# Patient Record
Sex: Male | Born: 1975 | Race: White | Hispanic: No | Smoking: Never smoker
Health system: Southern US, Community
[De-identification: ages and names within clinical notes are randomized; demographics above are authoritative.]

## PROBLEM LIST (undated history)

## (undated) DIAGNOSIS — J302 Other seasonal allergic rhinitis: Secondary | ICD-10-CM

## (undated) HISTORY — PX: TONSILLECTOMY: SHX5217

## (undated) HISTORY — PX: WISDOM TOOTH EXTRACTION: SHX21

## (undated) HISTORY — DX: Other seasonal allergic rhinitis: J30.2

---
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
## 1996-12-17 HISTORY — PX: APPENDECTOMY: SHX54

## 2000-08-03 ENCOUNTER — Emergency Department (HOSPITAL_COMMUNITY): Admission: EM | Admit: 2000-08-03 | Discharge: 2000-08-03 | Payer: Self-pay | Admitting: Emergency Medicine

## 2000-08-03 ENCOUNTER — Encounter: Payer: Self-pay | Admitting: Emergency Medicine

## 2000-12-21 ENCOUNTER — Inpatient Hospital Stay (HOSPITAL_COMMUNITY): Admission: EM | Admit: 2000-12-21 | Discharge: 2000-12-23 | Payer: Self-pay | Admitting: *Deleted

## 2003-11-11 ENCOUNTER — Emergency Department (HOSPITAL_COMMUNITY): Admission: EM | Admit: 2003-11-11 | Discharge: 2003-11-11 | Payer: Self-pay | Admitting: Emergency Medicine

## 2006-04-29 ENCOUNTER — Ambulatory Visit: Payer: Self-pay | Admitting: Family Medicine

## 2006-05-15 ENCOUNTER — Ambulatory Visit: Payer: Self-pay | Admitting: Family Medicine

## 2006-07-10 ENCOUNTER — Ambulatory Visit: Payer: Self-pay | Admitting: Family Medicine

## 2007-12-16 ENCOUNTER — Ambulatory Visit: Payer: Self-pay | Admitting: Pulmonary Disease

## 2007-12-16 DIAGNOSIS — R059 Cough, unspecified: Secondary | ICD-10-CM | POA: Insufficient documentation

## 2007-12-16 DIAGNOSIS — R05 Cough: Secondary | ICD-10-CM

## 2008-01-04 DIAGNOSIS — J309 Allergic rhinitis, unspecified: Secondary | ICD-10-CM | POA: Insufficient documentation

## 2011-05-04 NOTE — H&P (Signed)
[facility]  Patient:    Jeffrey Quinn, Jeffrey Quinn                      MRN: [id number] Adm. Date:  [id number] Attending:  Otilio Saber Dictator:   Young Berry Lorin Picket, N.P.                   Psychiatric Admission Assessment  DATE OF ADMISSION:  December 21, 2000  PATIENT IDENTIFICATION:  This is a 35 year old white male, single, voluntary admission to the [facility] on December 21, 2000.  REASON FOR ADMISSION AND SYMPTOMS:  Patients chief complaint is that he has a problem with emotional stress and suicidal thoughts from breakup with his girlfriend.  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  At that time he met a 35 year old girl, a student there.  The patient eventually gave up his job to pursue the relationship and the girlfriend subsequently broke up with him just this past week after dating him for an entire year. Patient began feeling depressed about 3 months ago over the cumulative loss of his job and also had really changed his circle of friends and given up a lot of his previous social supports because of the relationship with this girl, and realizing this had increased his depression and he began having thoughts of not wanting to live.  Over the course of January 4th and 5th he broke up with the girl and had an acute grief reaction where he was sad, he was crying, with thoughts of wanting to die, without a specific suicidal plan.  He called [facility] answering service and was referred for admission.  Now today he denies that he is having any suicidal ideation at this time, that that has pretty much subsided.  He feels calmer, and he denies any auditory or visual hallucinations.  He does feel quite fatigued and he feels extremely sad and despondent over the relationship.  Over the course of the past 2 to 3 weeks, he has felt  increasingly depressed, but states that basically his sleep and appetite were quite good up until the last few days. During the time he was breaking up with her, he had really not been able to eat or sleep for the past 3 to 4 days.  PAST PSYCHIATRIC HISTORY:  Patient saw Dr. Janann August at [facility] one time 5 years ago when he was having adjustment problems in college.  He also took Prozac 20 mg per day through his freshman and sophomore year that was prescribed by his primary care Patina Spanier and eventually weaned himself off that.  He has been seeing Drenda Freeze Dito for the past 3 months as he began to become more depressed.  This Herb Grays is the counselor at [facility], and he saw his primary care Ziyad Dyar for a prescription for Prozac.  He has no previous history of suicidal attempts.  He has no previous hospitalizations.  SUBSTANCE ABUSE HISTORY:  He denies any alcohol or drug use and he is a nonsmoker.  PAST MEDICAL HISTORY:  The patient is cared for by Dr. Tery Sanfilippo at [facility].  He was last seen there December 12 to increase his Prozac dose to 40 mg.  Medical problems:  Only medical problem is seasonal allergies occasionally.  Medications that he takes are none.  Drug allergies are none.  POSITIVE PHYSICAL FINDINGS:  His PE is pending.  He is a well-nourished, well- developed white male in no acute distress, healthy in appearance.  On admission, his temp was 97.1, pulse was 69, respirations 18, blood pressure 140/68 sitting and 130/76 standing.  Today, his blood pressure has come down some.  It was 128/68 sitting and standing 103/61, and it is noticed that he does have postural systolic change for which he is completely asymptomatic. He is 5 feet 11 inches tall and is 205 pounds.  SOCIAL HISTORY:  Patient lives with his family in [place] in 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  He considers himself athletic and considers himself a religious individual. Although he has had suicidal thoughts, he has rejected these out of concern for his family and for religious reasons.  FAMILY HISTORY:  Positive for his mother having anxiety and panic attacks, positive for 1 sister who has been hospitalized for major depressive disorder, and positive for a grandmother with acute depression involving electro- convulsive therapy.  MENTAL STATUS EXAMINATION:  This is a casually dressed, neatly groomed, mildly anxious and cooperative individual with good eye contact.  In general throughout the interview his speech is normal in rate and tone.  His mood is predominantly sad and tearful.  Thought process is logical, coherent and relevant, without evidence of psychotic symptoms.  Cognitively, he is oriented x 3 and is cognitively intact.  He does not have any suicidal ideation at this time, and he is able to be safe on the unit.  ADMISSION DIAGNOSES: Axis I:    1. Major depression with suicidal ideation.            2. 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 Axis II:   None. Axis III:  None. Axis IV:   Moderate problems with his primary support group. Axis V:    Current 36, past year 51.  INITIAL PLAN OF CARE:  Admit him and we will work to stabilize his mood and thinking and allow him to process his acute grief, and he seems to be making progress doing that.  We have already discussed a plan for follow up with Drenda Freeze Dito after discharge,  probably at least on a weekly basis, and we would consider having him followed for ongoing medication checks either with his primary care physician or with [facility].  We will continue his Prozac at this point at 40 mg daily and will monitor his mood and thinking, and we will give him Ambien 5  mg p.r.n. for sleep at night.  ESTIMATED LENGTH OF STAY:  Two to three days. DD:  12/22/00 TD:  12/22/00 Job: [id number]

## 2011-05-04 NOTE — Discharge Summary (Signed)
[facility]  Patient:    Jeffrey Quinn, Jeffrey Quinn                      MRN: [id number] Adm. Date:  [id number] Disc. Date: [id number] Attending:  Otilio Saber                           Discharge Summary  HISTORY OF PRESENT ILLNESS:  Mr. Jeffrey Quinn is a 35 year old single white male admitted with a history of depression and suicidal ideation.  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  He eventually gave up his job to pursue a relationship. His girlfriend subsequently broke up with him the week prior to admission after dating him for one year.  The patient began feeling depressed three months prior to admission over the loss of his job, the change of his life circumstances as a result of the relationship and ongoing conflicts in the relationship.  He became acutely distraught after their breakup and began having suicidal thoughts.  He had reported decreased sleep and appetite over the several days prior to admission.  The patient had seen Dr. Janann August of [facility] one time five years ago when he was having adjustment problems in college.  He took Prozac through his freshman and sophomore years prescribed by his primary care Evelynne Spiers.  He has also been seeing Lawson Radar for three months for therapy and was on Prozac from his primary care physician.  He had no previous psychiatric hospitalizations.  He is followed medically by Dr. Tery Sanfilippo with [facility].  ADMISSION MEDICATIONS:  At the time of admission, he was on Prozac 40 mg q.d.  ALLERGIES:  He had no known drug allergies.  PHYSICAL EXAMINATION:  The physical exam on admission showed no significant findings.  MENTAL STATUS EXAMINATION:  The examination revealed a casually dressed, neatly groomed young white male.  Speech was normal.  Thought processes were logical and coherent without evidence of psychosis.  He  was denying further suicide ideation.  Mood was _____.  Affect was sad and tearful.  Oriented x 3.  Cognitive function was intact.  ADMITTING DIAGNOSES: Axis I:    1. Major depression, severe.            2. Adjustment disorder, not otherwise specified. Axis II:   No diagnosis. Axis III:  No diagnosis. Axis IV:   Psychosocial stressors moderate. Axis V:    Global assessment of functioning currently 35, highest in            the past year is 75.  HOSPITAL COURSE:  The patient was admitted to the [facility] for treatment of depression to prevent any suicidal acting out.  The patient was continued on Prozac and was seen in individual therapy.  He was able to talk through some of his feelings about loss of his relationship and previous job. He was denying any further suicidal thoughts and thought that he was able to work with getting on with his life.  He agreed to leave the relationship alone until he had worked through these issues.   It was felt that he could be managed again on an outpatient basis.  DISCHARGE CONDITION:  The patient is discharged in improved condition with improvement in his mood, sleep and appetite and alleviation of his suicidal ideation.  DISPOSITION:  The  patient is discharged home.  He was to follow up in my office on December 26, 2000, and was to meet with Lawson Radar through my office on December 25, 2000.  DISCHARGE MEDICATIONS:  Prozac 40 mg q.d.  FINAL DIAGNOSES: Axis I:    1. Depression, not otherwise specified. Axis II:   No diagnosis. Axis III:  No diagnosis. Axis IV:   Psychosocial stressors moderate. Axis V:    Global assessment of functioning current 52, highest            in the past year is 75. DD:  01/24/01 TD:  01/25/01 Job: [id number]

## 2014-12-10 ENCOUNTER — Encounter (HOSPITAL_COMMUNITY): Payer: Self-pay | Admitting: Emergency Medicine

## 2014-12-10 ENCOUNTER — Emergency Department (HOSPITAL_COMMUNITY)
Admission: EM | Admit: 2014-12-10 | Discharge: 2014-12-10 | Disposition: A | Discharge status: Home / Self Care | Payer: BC Managed Care – PPO | Attending: Emergency Medicine | Admitting: Emergency Medicine

## 2014-12-10 ENCOUNTER — Emergency Department (HOSPITAL_COMMUNITY): Payer: BC Managed Care – PPO

## 2014-12-10 ENCOUNTER — Emergency Department (HOSPITAL_COMMUNITY)
Admission: EM | Admit: 2014-12-10 | Discharge: 2014-12-11 | Disposition: A | Discharge status: Home / Self Care | Payer: BC Managed Care – PPO | Source: Home / Self Care | Attending: Emergency Medicine | Admitting: Emergency Medicine

## 2014-12-10 DIAGNOSIS — M549 Dorsalgia, unspecified: Secondary | ICD-10-CM

## 2014-12-10 DIAGNOSIS — Z7951 Long term (current) use of inhaled steroids: Secondary | ICD-10-CM | POA: Insufficient documentation

## 2014-12-10 DIAGNOSIS — M545 Low back pain: Secondary | ICD-10-CM | POA: Insufficient documentation

## 2014-12-10 DIAGNOSIS — Z87442 Personal history of urinary calculi: Secondary | ICD-10-CM | POA: Diagnosis not present

## 2014-12-10 DIAGNOSIS — Z79899 Other long term (current) drug therapy: Secondary | ICD-10-CM | POA: Insufficient documentation

## 2014-12-10 DIAGNOSIS — M62830 Muscle spasm of back: Secondary | ICD-10-CM | POA: Insufficient documentation

## 2014-12-10 DIAGNOSIS — Z791 Long term (current) use of non-steroidal anti-inflammatories (NSAID): Secondary | ICD-10-CM | POA: Insufficient documentation

## 2014-12-10 LAB — I-STAT CHEM 8, ED
BUN: 32 mg/dL — ABNORMAL HIGH (ref 6–23)
Calcium, Ion: 1.17 mmol/L (ref 1.12–1.23)
Chloride: 99 mEq/L (ref 96–112)
Creatinine, Ser: 1.1 mg/dL (ref 0.50–1.35)
GLUCOSE: 106 mg/dL — AB (ref 70–99)
HEMATOCRIT: 48 % (ref 39.0–52.0)
Hemoglobin: 16.3 g/dL (ref 13.0–17.0)
Potassium: 4.5 mmol/L (ref 3.5–5.1)
SODIUM: 138 mmol/L (ref 135–145)
TCO2: 28 mmol/L (ref 0–100)

## 2014-12-10 LAB — URINALYSIS, ROUTINE W REFLEX MICROSCOPIC
Bilirubin Urine: NEGATIVE
Glucose, UA: NEGATIVE mg/dL
Hgb urine dipstick: NEGATIVE
Ketones, ur: NEGATIVE mg/dL
Leukocytes, UA: NEGATIVE
NITRITE: NEGATIVE
Protein, ur: NEGATIVE mg/dL
SPECIFIC GRAVITY, URINE: 1.017 (ref 1.005–1.030)
Urobilinogen, UA: 0.2 mg/dL (ref 0.0–1.0)
pH: 7 (ref 5.0–8.0)

## 2014-12-10 LAB — CBG MONITORING, ED: GLUCOSE-CAPILLARY: 82 mg/dL (ref 70–99)

## 2014-12-10 MED ORDER — NAPROXEN 500 MG PO TABS: 500.0000 mg | ORAL_TABLET | Freq: Two times a day (BID) | ORAL | Status: DC

## 2014-12-10 MED ORDER — DIAZEPAM 5 MG PO TABS: 5.0000 mg | ORAL_TABLET | Freq: Four times a day (QID) | ORAL | Status: DC | PRN

## 2014-12-10 MED ORDER — DIAZEPAM 5 MG PO TABS
5.0000 mg | ORAL_TABLET | Freq: Once | ORAL | Status: AC
  Administered 2014-12-10: 5 mg via ORAL
  Filled 2014-12-10: qty 1

## 2014-12-10 MED ORDER — DIAZEPAM 5 MG/ML IJ SOLN
5.0000 mg | Freq: Once | INTRAMUSCULAR | Status: AC
  Administered 2014-12-11: 5 mg via INTRAVENOUS
  Filled 2014-12-10: qty 2

## 2014-12-10 MED ORDER — HYDROMORPHONE HCL 1 MG/ML IJ SOLN
1.0000 mg | INTRAMUSCULAR | Status: DC | PRN
  Administered 2014-12-10 (×2): 1 mg via INTRAVENOUS
  Filled 2014-12-10 (×2): qty 1

## 2014-12-10 MED ORDER — KETOROLAC TROMETHAMINE 60 MG/2ML IM SOLN
60.0000 mg | Freq: Once | INTRAMUSCULAR | Status: AC
  Administered 2014-12-10: 60 mg via INTRAMUSCULAR
  Filled 2014-12-10: qty 2

## 2014-12-10 MED ORDER — OXYCODONE-ACETAMINOPHEN 5-325 MG PO TABS: 2.0000 | ORAL_TABLET | ORAL | Status: DC | PRN

## 2014-12-10 NOTE — ED Provider Notes (Signed)
CSN: [id number]     Arrival date & time 12/10/14  2155 History   First MD Initiated Contact with Patient 12/10/14 2200     Chief Complaint  Patient presents with  . Back Pain    HPI Patient presents to the emergency room with complaints of persistent low back pain. Patient initially injured himself while lifting weights a few weeks ago. He was getting treatment at a chiropractor with intermittent relief. Patient is here visiting family. He started having pain again a few days ago. He went to a chiropractor and had a treatment that offered some temporary relief again. This morning the pain was more severe. The patient felt like his back was locking up.  Pain is in his lower back and then radiates throughout the spine. He denies any trouble with nausea or vomiting. He has no trouble with numbness or weakness. He is not having any issues with urination. The pain goes down towards his hips but does not radiate to his legs or feet. Movement increases the pain. He was seen in the emergency department earlier today and given pain medications. He tried taking them at home but they were ineffective so he came back into the ED this evening. History reviewed. No pertinent past medical history. History reviewed. No pertinent past surgical history. No family history on file. History  Substance Use Topics  . Smoking status: Never Smoker   . Smokeless tobacco: Not on file  . Alcohol Use: No    Review of Systems  All other systems reviewed and are negative.     Allergies  Review of patient's allergies indicates no known allergies.  Home Medications   Prior to Admission medications   Medication Sig Start Date End Date Taking? Authorizing Provider  diazepam (VALIUM) 5 MG tablet Take 1 tablet (5 mg total) by mouth every 6 (six) hours as needed for anxiety (spasms). 12/10/14   Earle GellBenjamin W Cartner, PA-C  Docusate Calcium (STOOL SOFTENER PO) Take 2 capsules by mouth daily.    Historical Provider, MD   fluticasone (FLONASE) 50 MCG/ACT nasal spray Place 2 sprays into both nostrils daily.    Historical Provider, MD  KRILL OIL PO Take 1 capsule by mouth daily.    Historical Provider, MD  loratadine (CLARITIN) 10 MG tablet Take 10 mg by mouth daily.    Historical Provider, MD  methocarbamol (ROBAXIN) 500 MG tablet Take 500 mg by mouth 2 (two) times daily.    Historical Provider, MD  naproxen (NAPROSYN) 500 MG tablet Take 1 tablet (500 mg total) by mouth 2 (two) times daily. 12/10/14   Earle GellBenjamin W Cartner, PA-C  naproxen sodium (ANAPROX) 220 MG tablet Take 660 mg by mouth 2 (two) times daily with a meal.    Historical Provider, MD  niacin 100 MG tablet Take 100 mg by mouth daily.    Historical Provider, MD  Omega-3 Fatty Acids (FISH OIL) 1000 MG CAPS Take 2 capsules by mouth daily.    Historical Provider, MD  oxyCODONE-acetaminophen (PERCOCET/ROXICET) 5-325 MG per tablet Take 2 tablets by mouth every 4 (four) hours as needed for moderate pain or severe pain. 12/10/14   Earle GellBenjamin W Cartner, PA-C  ranitidine (ZANTAC) 150 MG tablet Take 150 mg by mouth daily.    Historical Provider, MD   BP 117/68 mmHg  Pulse 62  Temp(Src) 98.2 F (36.8 C) (Oral)  Resp 16  SpO2 95% Physical Exam  Constitutional: He appears well-developed and well-nourished.  HENT:  Head: Normocephalic and atraumatic.  Right Ear: External ear normal.  Left Ear: External ear normal.  Nose: Nose normal.  Eyes: Conjunctivae and EOM are normal.  Neck: Neck supple. No tracheal deviation present.  Pulmonary/Chest: Effort normal. No stridor. No respiratory distress.  Musculoskeletal: He exhibits no edema or tenderness.       Lumbar back: He exhibits decreased range of motion, pain and spasm. He exhibits no swelling and no edema.  Neurological: He is alert. He is not disoriented. No cranial nerve deficit or sensory deficit. He exhibits normal muscle tone. Coordination normal.  Reflex Scores:      Patellar reflexes are 2+ on the right  side and 2+ on the left side.      Achilles reflexes are 2+ on the right side and 2+ on the left side. Skin: Skin is warm and dry. No rash noted. He is not diaphoretic. No erythema.  Psychiatric: He has a normal mood and affect. His behavior is normal. Thought content normal.  Nursing note and vitals reviewed.   ED Course  Procedures (including critical care time) Labs Review Labs Reviewed  I-STAT CHEM 8, ED - Abnormal; Notable for the following:    BUN 32 (*)    Glucose, Bld 106 (*)    All other components within normal limits  URINALYSIS, ROUTINE W REFLEX MICROSCOPIC    Imaging Review Dg Lumbar Spine Complete  12/10/2014   CLINICAL DATA:  Low back pain. Weight lifting injury 1 month ago, originally getting better but recurrent today.  EXAM: LUMBAR SPINE - COMPLETE 4+ VIEW  COMPARISON:  11/26/2005  FINDINGS: A transitional lumbosacral vertebra is assumed to represent the S1 level. Careful correlation with this numbering strategy prior to any procedural intervention would be recommended. Mild degenerative facet arthropathy on the right at L5-S1. No malalignment or significant loss of intervertebral disc height. No acute bony findings.  IMPRESSION: 1. No fracture, subluxation, or acute findings. 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   Electronically Signed   By: Herbie BaltimoreWalt  Liebkemann M.D.   On: 12/10/2014 13:49     EKG Interpretation None      MDM   Final diagnoses:  Low back pain without sciatica, unspecified back pain laterality    Pt's symptoms do not suggest a radicular etiology.   Family is concerned about his electrolytes.  He has been drinking a lot of water.  Will check istat and ua.  Plan on treating with pain medications.  No signs of acute radiculopathy or neuro dysfucntion.   Jeffrey DibblesJon Daana Petrasek, MD 12/10/14 [id number]

## 2014-12-10 NOTE — ED Notes (Signed)
Patient reports taking Valium, Naproxsyn and Percocet at approximately 1830 without relief.

## 2014-12-10 NOTE — ED Notes (Signed)
Pt reports pain better. Feels a little more relaxed.  States pain was 9-10/10 on arrival now its 7-8/10.

## 2014-12-10 NOTE — ED Notes (Signed)
Pt lightheaded when standing up.  States he feels like he's going to pass out.

## 2014-12-10 NOTE — ED Notes (Signed)
Pt walked out.  He looked pale but states he is fine and left.

## 2014-12-10 NOTE — ED Notes (Signed)
Blood sugar 82.

## 2014-12-10 NOTE — Discharge Instructions (Signed)
Back Pain, Adult °Low back pain is very common. About 1 in 5 people have back pain. The cause of low back pain is rarely dangerous. The pain often gets better over time. About half of people with a sudden onset of back pain feel better in just 2 weeks. About 8 in 10 people feel better by 6 weeks.  °CAUSES °Some common causes of back pain include: °· Strain of the muscles or ligaments supporting the spine. °· Wear and tear (degeneration) of the spinal discs. °· Arthritis. °· Direct injury to the back. °DIAGNOSIS °Most of the time, the direct cause of low back pain is not known. However, back pain can be treated effectively even when the exact cause of the pain is unknown. Answering your caregiver's questions about your overall health and symptoms is one of the most accurate ways to make sure the cause of your pain is not dangerous. If your caregiver needs more information, he or she may order lab work or imaging tests (X-rays or MRIs). However, even if imaging tests show changes in your back, this usually does not require surgery. °HOME CARE INSTRUCTIONS °For many people, back pain returns. Since low back pain is rarely dangerous, it is often a condition that people can learn to manage on their own.  °· Remain active. It is stressful on the back to sit or stand in one place. Do not sit, drive, or stand in one place for more than 30 minutes at a time. Take short walks on level surfaces as soon as pain allows. Try to increase the length of time you walk each day. °· Do not stay in bed. Resting more than 1 or 2 days can delay your recovery. °· Do not avoid exercise or work. Your body is made to move. It is not dangerous to be active, even though your back may hurt. Your back will likely heal faster if you return to being active before your pain is gone. °· Pay attention to your body when you  bend and lift. Many people have less discomfort when lifting if they bend their knees, keep the load close to their bodies, and  avoid twisting. Often, the most comfortable positions are those that put less stress on your recovering back. °· Find a comfortable position to sleep. Use a firm mattress and lie on your side with your knees slightly bent. If you lie on your back, put a pillow under your knees. °· Only take over-the-counter or prescription medicines as directed by your caregiver. Over-the-counter medicines to reduce pain and inflammation are often the most helpful. Your caregiver may prescribe muscle relaxant drugs. These medicines help dull your pain so you can more quickly return to your normal activities and healthy exercise. °· Put ice on the injured area. °¨ Put ice in a plastic bag. °¨ Place a towel between your skin and the bag. °¨ Leave the ice on for 15-20 minutes, 03-04 times a day for the first 2 to 3 days. After that, ice and heat may be alternated to reduce pain and spasms. °· Ask your caregiver about trying back exercises and gentle massage. This may be of some benefit. °· Avoid feeling anxious or stressed. Stress increases muscle tension and can worsen back pain. It is important to recognize when you are anxious or stressed and learn ways to manage it. Exercise is a great option. °SEEK MEDICAL CARE IF: °· You have pain that is not relieved with rest or medicine. °· You have pain that does not improve in 1 week. °· You have new symptoms. °· You are generally not feeling well. °SEEK   IMMEDIATE MEDICAL CARE IF:   You have pain that radiates from your back into your legs.  You develop new bowel or bladder control problems.  You have unusual weakness or numbness in your arms or legs.  You develop nausea or vomiting.  You develop abdominal pain.  You feel faint. Document Released: 12/03/2005 Document Revised: 06/03/2012 Document Reviewed: 04/06/2014 [facility] Patient Information 2015 [employer], [place]. This information is not intended to replace advice given to you by your health care provider. Make sure you  discuss any questions you have with your health care provider.   You were evaluated in the ED today for your back pain. There does not appear to be an emergent cause for her symptoms at this time. Please take your pain medicine as directed for severe pain. Take your naproxen as directed for the next 2 weeks. Please follow-up with primary care for further evaluation and management of your symptoms. Return to ED for worsening symptoms, worsening pain, fevers, numbness or weakness, difficulties urinating or going to the bathroom.

## 2014-12-10 NOTE — ED Notes (Signed)
Was lifting something heavy 3 weeks ago and "tweeked" his back and then dropped the weight.  Pt has had pain in LS spine since then.  Family at bedside.  Has been stretching and taking ice baths since then and it was getting better.  State he got out of bed today with extreem pain today.  States weakness below nipple line but denies any type of incontinence. Mother at bedside.  IV placed. And pain meds given.  Pt gone to xray

## 2014-12-10 NOTE — ED Notes (Signed)
Bed: [id number] Expected date:  Expected time:  Means of arrival:  Comments: Back pain - EMS

## 2014-12-10 NOTE — ED Notes (Addendum)
Patient rates pain 6/10 since EMS gave 150mcg Fentanyl. C/o spasms and cramping. Denies any other symptoms at this time.

## 2014-12-10 NOTE — ED Notes (Signed)
Patient presents from home via EMS for back pain. Patient reports injury to back a few week prior while working out. Patient seen earlier today for same. Patient prescribed medications without relief.   BP110/66, 60HR,   150mcg Fentanyl in route (last does 2144), pain 5/10 upon arrival.

## 2014-12-10 NOTE — ED Provider Notes (Signed)
CSN: [id number]     Arrival date & time 12/10/14  1243 History   First MD Initiated Contact with Patient 12/10/14 1258     Chief Complaint  Patient presents with  . Back Pain     (Consider location/radiation/quality/duration/timing/severity/associated sxs/prior Treatment) HPI Jeffrey Quinn is a 38 y.o. male with a history of nephrolithiasis, appendectomy comes in for evaluation of back pain. Patient states approximately 3-1/2 weeks ago he was power lifting, performed a power clean and "tweaked his back". He is since visited a chiropractor who "popped his back" which improved his pain for approximately 4 hours. He has tried Percocet at home with minimal relief. He is concerned because when he woke up this morning he felt like his hips were too far from his spine "in an S shape". He denies this feeling now. He reports the pain is an achiness and is localized to his right lower lumbar spine. He also reports intermittent shooting pains in both of his legs. He denies fevers, night sweats, personal history of cancer, IV drug use, numbness or weakness, loss of bowel or bladder, chronic steroid use.  History reviewed. No pertinent past medical history. History reviewed. No pertinent past surgical history. History reviewed. No pertinent family history. History  Substance Use Topics  . Smoking status: Never Smoker   . Smokeless tobacco: Not on file  . Alcohol Use: No    Review of Systems A complete 10 point review of systems was completed and was negative except for pertinent positives and negatives as mentioned in the history of present illness.   Allergies  Review of patient's allergies indicates no known allergies.  Home Medications   Prior to Admission medications   Medication Sig Start Date End Date Taking? Authorizing Provider  Docusate Calcium (STOOL SOFTENER PO) Take 2 capsules by mouth daily.   Yes Historical Provider, MD  fluticasone (FLONASE) 50 MCG/ACT nasal spray Place 2  sprays into both nostrils daily.   Yes Historical Provider, MD  KRILL OIL PO Take 1 capsule by mouth daily.   Yes Historical Provider, MD  loratadine (CLARITIN) 10 MG tablet Take 10 mg by mouth daily.   Yes Historical Provider, MD  methocarbamol (ROBAXIN) 500 MG tablet Take 500 mg by mouth 2 (two) times daily.   Yes Historical Provider, MD  naproxen sodium (ANAPROX) 220 MG tablet Take 660 mg by mouth 2 (two) times daily with a meal.   Yes Historical Provider, MD  niacin 100 MG tablet Take 100 mg by mouth daily.   Yes Historical Provider, MD  Omega-3 Fatty Acids (FISH OIL) 1000 MG CAPS Take 2 capsules by mouth daily.   Yes Historical Provider, MD  ranitidine (ZANTAC) 150 MG tablet Take 150 mg by mouth daily.   Yes Historical Provider, MD  diazepam (VALIUM) 5 MG tablet Take 1 tablet (5 mg total) by mouth every 6 (six) hours as needed for anxiety (spasms). 12/10/14   Earle GellBenjamin W Tarnisha Kachmar, PA-C  naproxen (NAPROSYN) 500 MG tablet Take 1 tablet (500 mg total) by mouth 2 (two) times daily. 12/10/14   Sharlene MottsBenjamin W Damarie Schoolfield, PA-C  oxyCODONE-acetaminophen (PERCOCET/ROXICET) 5-325 MG per tablet Take 2 tablets by mouth every 4 (four) hours as needed for moderate pain or severe pain. 12/10/14   Earle GellBenjamin W Hines Kloss, PA-C   BP 138/76 mmHg  Pulse 84  Temp(Src) 97.8 F (36.6 C) (Oral)  Resp 20  SpO2 99% Physical Exam  Constitutional:  Awake, alert, nontoxic appearance with baseline speech.  HENT:  Head:  Atraumatic.  Eyes: Pupils are equal, round, and reactive to light. Right eye exhibits no discharge. Left eye exhibits no discharge.  Neck: Neck supple.  Cardiovascular: Normal rate and regular rhythm.   No murmur heard. Pulmonary/Chest: Effort normal and breath sounds normal. No respiratory distress. He has no wheezes. He has no rales. He exhibits no tenderness.  Abdominal: Soft. Bowel sounds are normal. He exhibits no mass. There is no tenderness. There is no rebound.  Musculoskeletal:  Tenderness diffusely  through back, specifically through paraspinal lumbar muscles. Mild tenderness over lumbar spine with no specific focal point of tenderness. Bilateral lower extremities non tender without new rashes or color change, baseline ROM with intact DP / PT pulses, CR<2 secs all digits bilaterally, sensation baseline light touch bilaterally for pt, DTR's symmetric and intact bilaterally KJ / AJ, motor symmetric bilateral 5 / 5 hip flexion, quadriceps, hamstrings, EHL, foot dorsiflexion, foot plantarflexion, gait somewhat antalgic but without apparent new ataxia.  Neurological:  Mental status baseline for patient.  Upper extremity motor strength and sensation intact and symmetric bilaterally.  Skin: No rash noted.  Psychiatric: He has a normal mood and affect.  Nursing note and vitals reviewed.   ED Course  Procedures (including critical care time) Labs Review Labs Reviewed  CBG MONITORING, ED    Imaging Review Dg Lumbar Spine Complete  12/10/2014   CLINICAL DATA:  Low back pain. Weight lifting injury 1 month ago, originally getting better but recurrent today.  EXAM: LUMBAR SPINE - COMPLETE 4+ VIEW  COMPARISON:  11/26/2005  FINDINGS: A transitional lumbosacral vertebra is assumed to represent the S1 level. Careful correlation with this numbering strategy prior to any procedural intervention would be recommended. Mild degenerative facet arthropathy on the right at L5-S1. No malalignment or significant loss of intervertebral disc height. No acute bony findings.  IMPRESSION: 1. No fracture, subluxation, or acute findings. 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   Electronically Signed   By: [name] M.D.   On: 12/10/2014 13:49     EKG Interpretation None     Meds given in ED:  Medications  diazepam (VALIUM) tablet 5 mg (5 mg Oral Given 12/10/14 1325)  ketorolac (TORADOL) injection 60 mg (60 mg Intramuscular Given 12/10/14 1325)    New Prescriptions    DIAZEPAM (VALIUM) 5 MG TABLET    Take 1 tablet (5 mg total) by mouth every 6 (six) hours as needed for anxiety (spasms).   NAPROXEN (NAPROSYN) 500 MG TABLET    Take 1 tablet (500 mg total) by mouth 2 (two) times daily.   OXYCODONE-ACETAMINOPHEN (PERCOCET/ROXICET) 5-325 MG PER TABLET    Take 2 tablets by mouth every 4 (four) hours as needed for moderate pain or severe pain.   Filed Vitals:   12/10/14 1253  BP: 138/76  Pulse: 84  Temp: 97.8 F (36.6 C)  TempSrc: Oral  Resp: 20  SpO2: 99%    MDM  OTIS PORTAL is a 38 y.o. male who presents for evaluation of back pain. Patient originally "tweaked his back" 3-1/2 weeks ago while power lifting. No pathologic back pain red flags  Vitals stable - WNL -afebrile Pt resting comfortably in ED. pain improved with above medications given in ED. PE--not concerning further acute or emergent pathology. Normal neuro exam. Patient's gait is somewhat antalgic but without any apparent ataxia.  Imaging--x-rays of lumbar spine show no acute osseous abnormalities.  DDX--no evidence of cauda equina, conus medullaris syndrome, epidural abscess  or other cord compression pathology. Likely musculoskeletal strain with potential disc herniation. Will treat conservatively.  Will DC with pain medicine, muscle relaxers and anti-inflammatories. Discussed f/u with PCP and return precautions, pt very amenable to plan.  Prior to patient discharge, I discussed and reviewed this case with Dr. Freida BusmanAllen   Final diagnoses:  Back pain        Earle GellBenjamin W Lohrvilleartner, PA-C 12/10/14 1609  Toy BakerAnthony T Allen, MD 12/12/14 [phone]

## 2014-12-10 NOTE — ED Notes (Signed)
PA Ben aware.  Pt given PO fluids

## 2014-12-10 NOTE — ED Notes (Signed)
Pt reports he had back worked on by chiropractor 3 weeks ago. Has gradually begun to have lower back pain through hips worse over past tow days. Pt is able to slowly ambulate. Does heavy lifting. Feels that back is in an "s" shape. No hx of back problems.

## 2014-12-11 MED ORDER — KETOROLAC TROMETHAMINE 60 MG/2ML IM SOLN
60.0000 mg | Freq: Once | INTRAMUSCULAR | Status: AC
  Administered 2014-12-11: 60 mg via INTRAMUSCULAR
  Filled 2014-12-11: qty 2

## 2014-12-11 MED ORDER — PREDNISONE 20 MG PO TABS
60.0000 mg | ORAL_TABLET | Freq: Once | ORAL | Status: AC
  Administered 2014-12-11: 60 mg via ORAL
  Filled 2014-12-11: qty 3

## 2014-12-11 NOTE — Discharge Instructions (Signed)

## 2014-12-11 NOTE — ED Notes (Signed)
Upon d/c patient unable to stand and sit in wheelchair. Patient reports pain increased to 8/10 when attempting to sit up in bed. Mother expresses concern about patient's status. Dr. Mora Bellmanni made aware of same. Will be in to reassess patient. Patient and mother updated on POC.

## 2014-12-11 NOTE — ED Provider Notes (Signed)
Patient signed out as pending Dilaudid treatments for back pain. Patient was attempted to be discharged but could not ambulate. Upon my assessment the patient has no red flags for back pain. He denies fever, IV drug use, malignancy. Patient states he lifted heavy weights 3 weeks ago, it improved over 2 weeks, then when he went running a got worse again over the last 2 days. Full dose of Dilaudid, I added Toradol and prednisone for treatment. He was advised to follow-up with her primary care physician when he gets back to [place] for an MRI and possible surgical intervention. It is my suspicion that this is low back pain that will resolve in 2-3 months of rest and NSAID use. His vital signs remain within his normal limits and the patient is safe for discharge.  Tomasita CrumbleAdeleke Devereaux Grayson, MD 12/11/14 [phone]

## 2015-07-15 IMAGING — CR DG LUMBAR SPINE COMPLETE 4+V
5 series · 5 of 5 positions shown · non-contrast
Comparison: 11/26/2005

CLINICAL DATA: Low back pain. Weight lifting injury 1 month ago,
originally getting better but recurrent today.

EXAM:
LUMBAR SPINE - COMPLETE 4+ VIEW

[t lumbar spine ap]
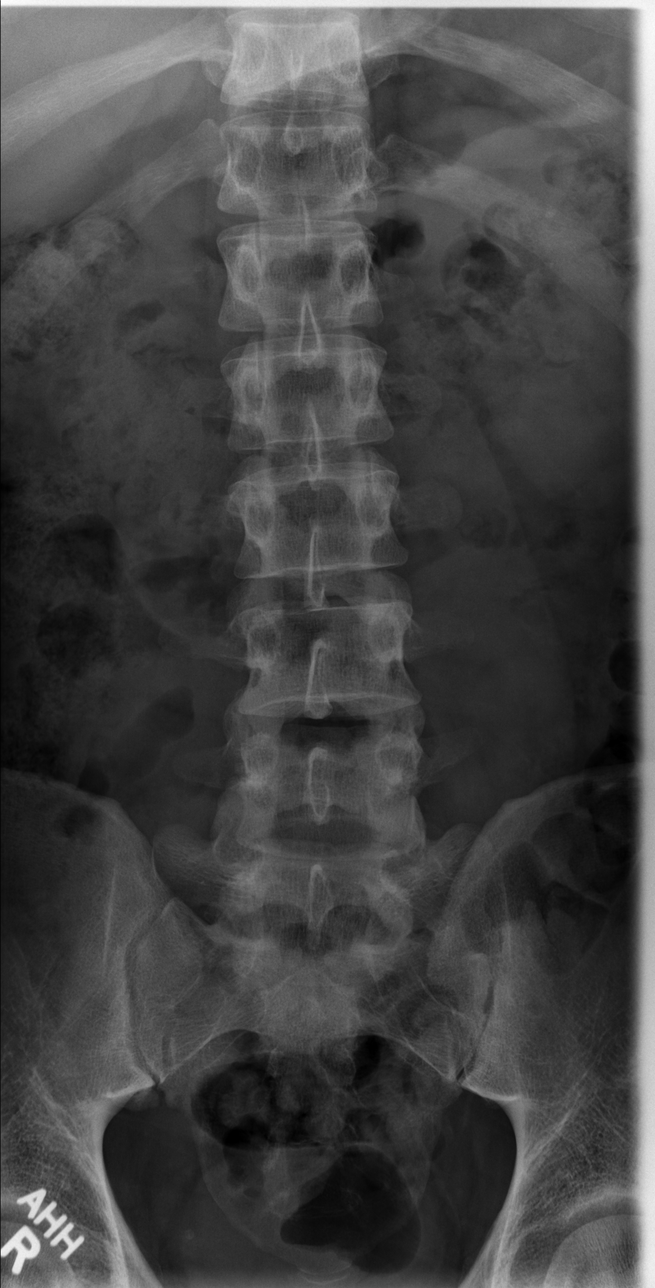

[t lumbar spine obl (1 of 2)]
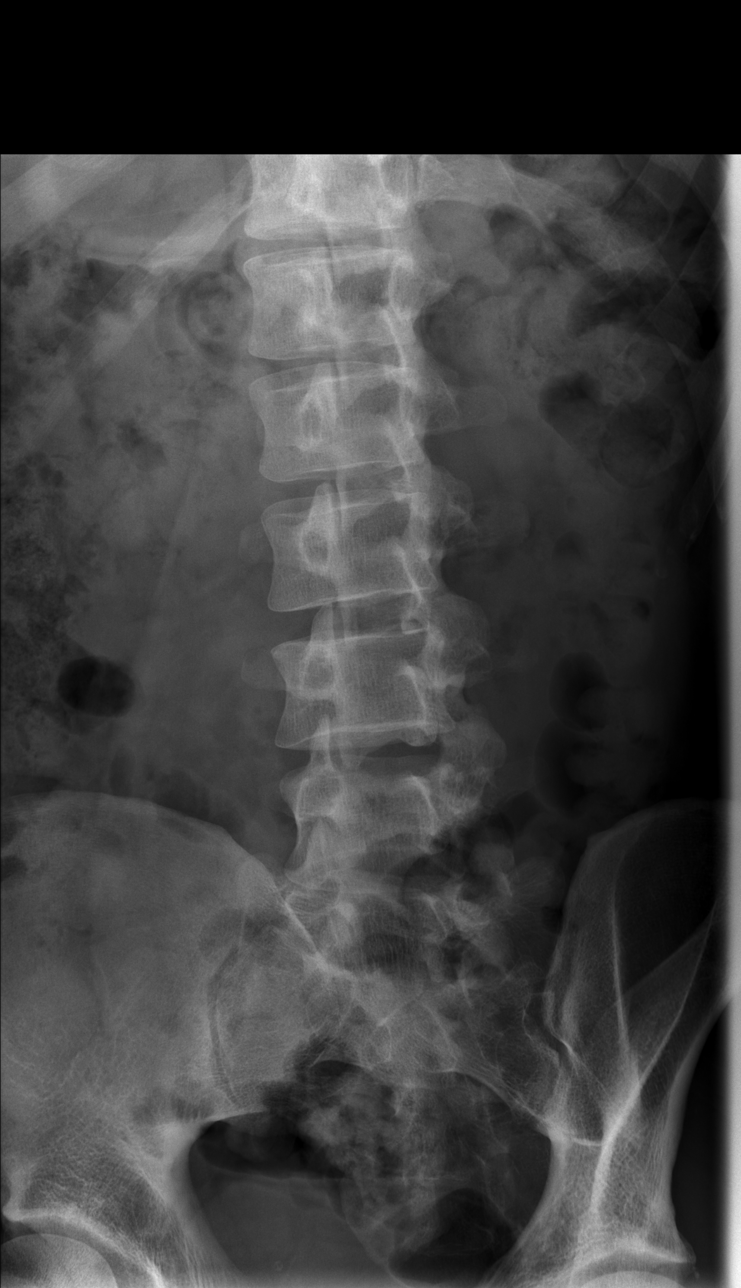

[t lumbar spine obl (2 of 2)]
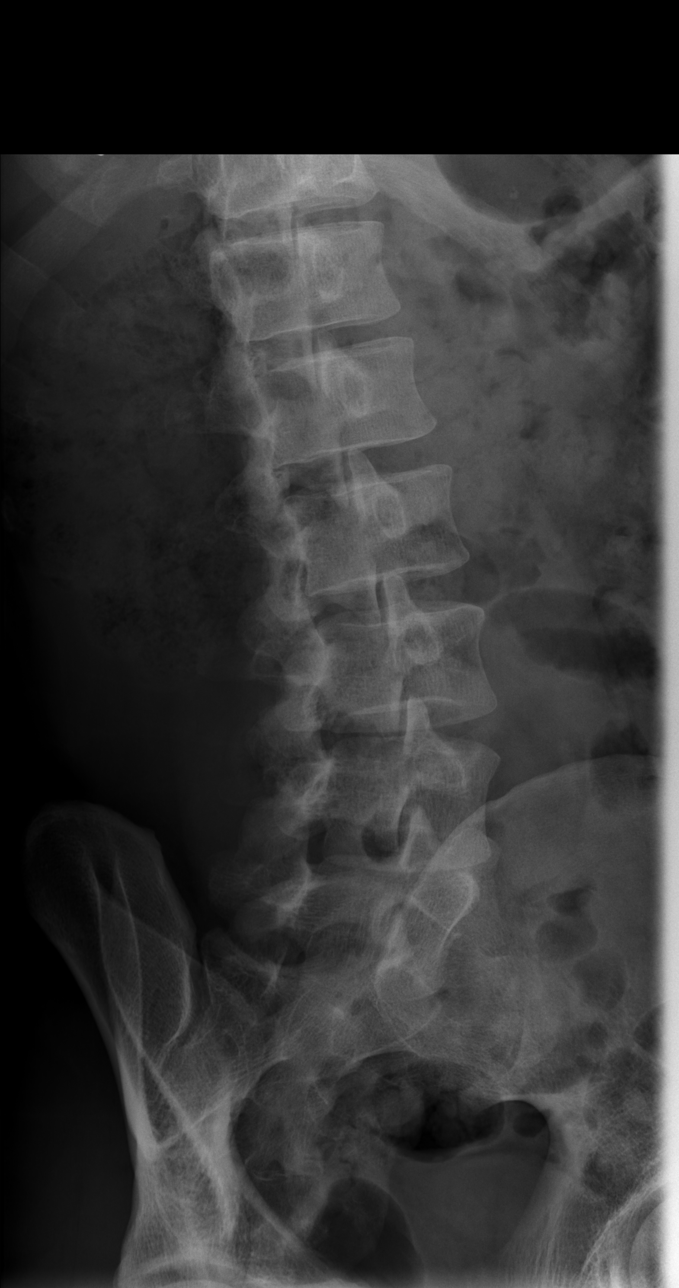

[t lumbar spine lat]
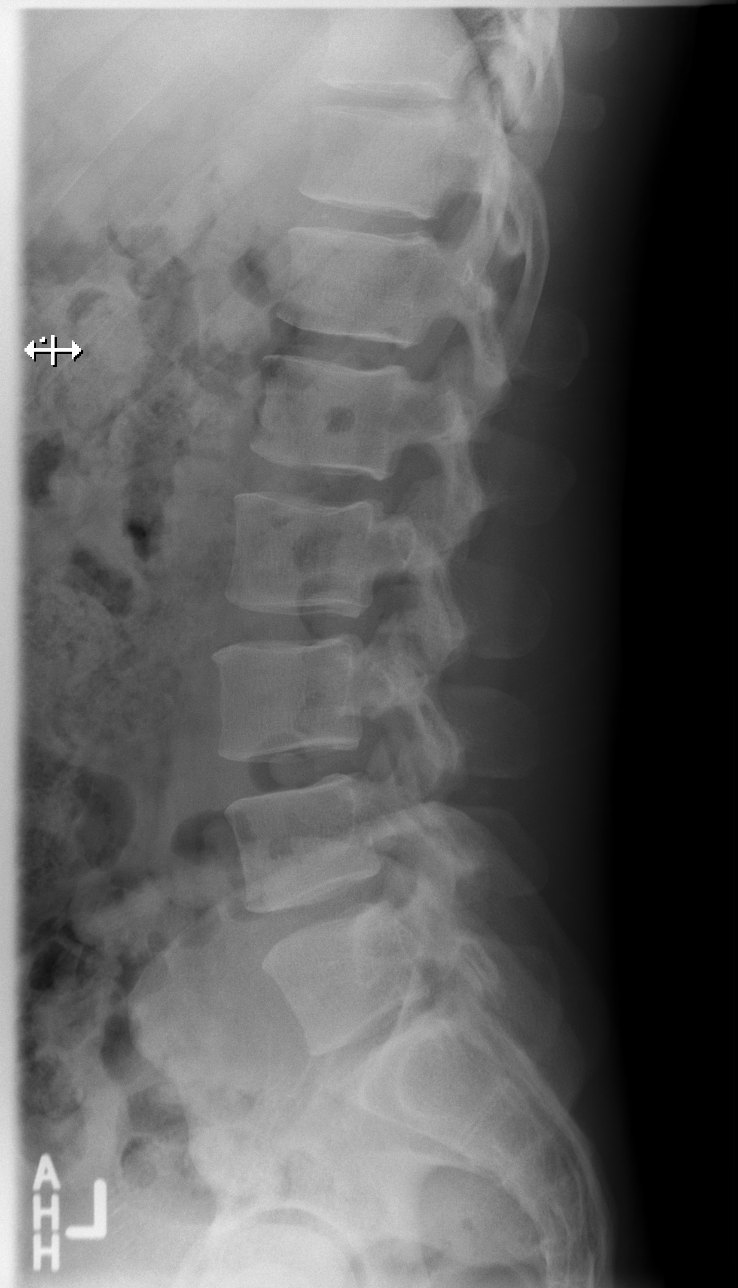

[t lumbar l-5 s-1 spot]
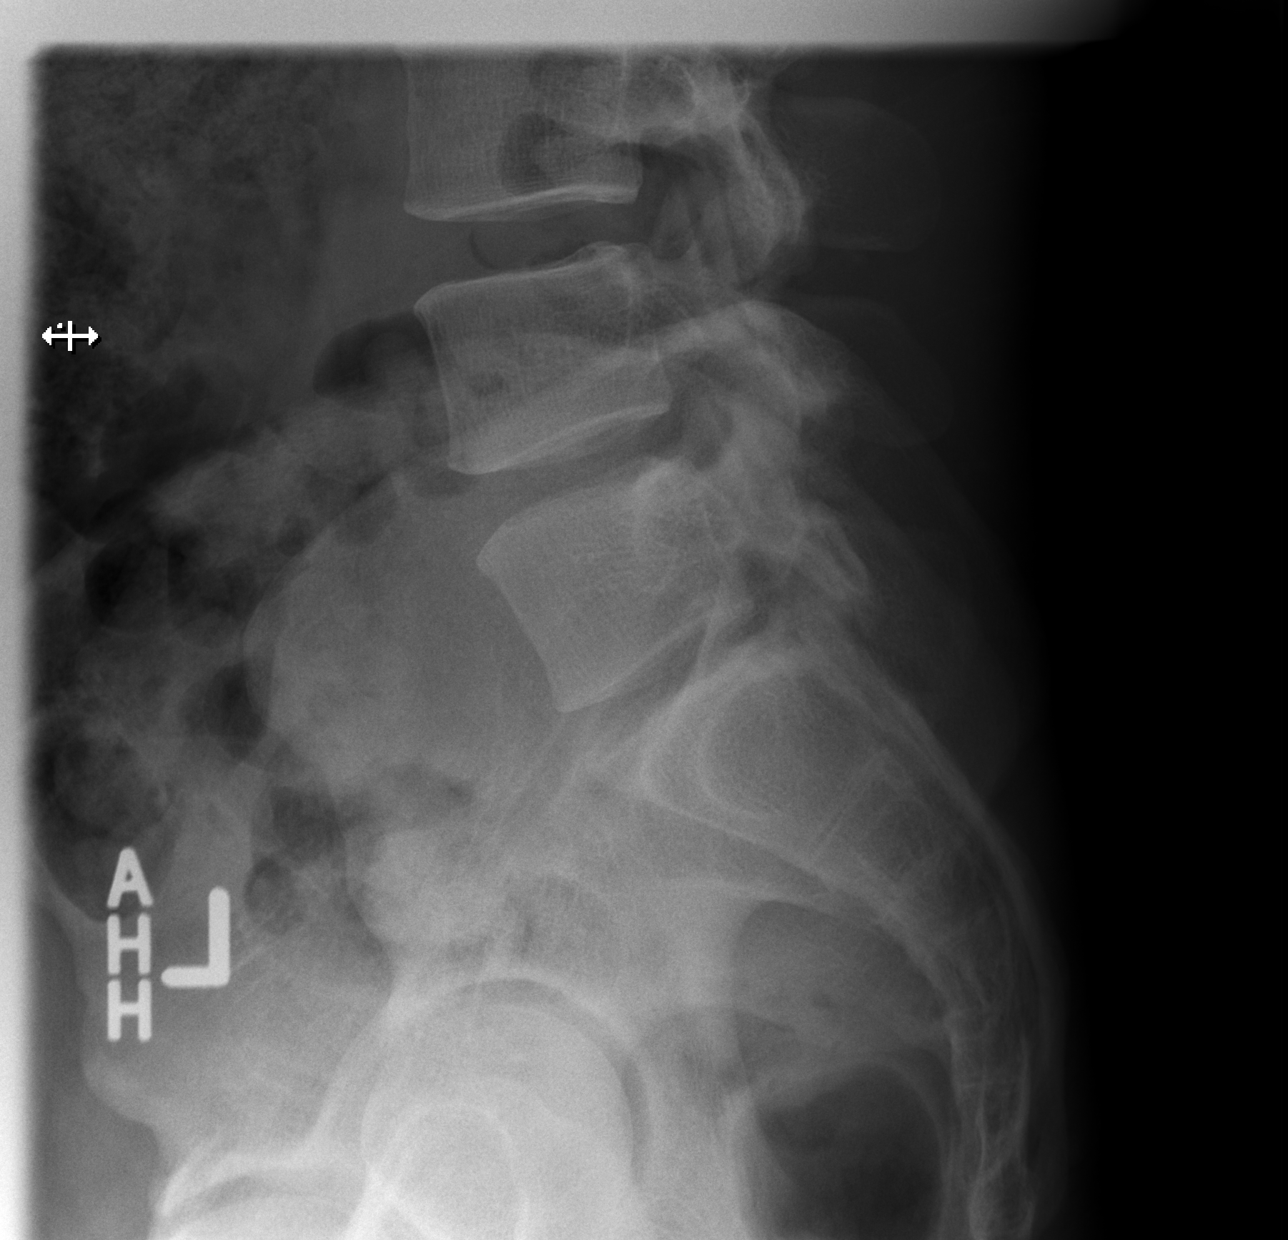

[5 of 5 positions shown; findings below may reference images not displayed]

FINDINGS: A transitional lumbosacral vertebra is assumed to represent the S1
level. Careful correlation with this numbering strategy prior to any
procedural intervention would be recommended. Mild degenerative
facet arthropathy on the right at L5-S1. No malalignment or
significant loss of intervertebral disc height. No acute bony
findings.
IMPRESSION: 1. No fracture, subluxation, or acute findings. Mild degenerative
right facet arthropathy at L5-S1.
2. Transitional S1 level.

## 2018-12-17 HISTORY — PX: PLANTAR FASCIA RELEASE: SHX2239

## 2023-05-29 ENCOUNTER — Encounter: Payer: Self-pay | Admitting: Internal Medicine

## 2023-06-12 ENCOUNTER — Encounter: Payer: Self-pay | Admitting: Internal Medicine

## 2023-06-12 ENCOUNTER — Ambulatory Visit (AMBULATORY_SURGERY_CENTER): Payer: Self-pay

## 2023-06-12 VITALS — Ht 72.0 in | Wt 205.0 lb

## 2023-06-12 DIAGNOSIS — Z1211 Encounter for screening for malignant neoplasm of colon: Secondary | ICD-10-CM

## 2023-06-12 MED ORDER — NA SULFATE-K SULFATE-MG SULF 17.5-3.13-1.6 GM/177ML PO SOLN: 1.0000 | Freq: Once | ORAL | 0 refills | Status: AC

## 2023-06-12 NOTE — Progress Notes (Signed)
Pre visit completed via phone call; Patient verified name, DOB, and address;  No egg or soy allergy known to patient;  No issues known to pt with past sedation with any surgeries or procedures; Patient denies ever being told they had issues or difficulty with intubation;  No FH of Malignant Hyperthermia; Pt is not on diet pills; Pt is not on home 02;  Pt is not on blood thinners;  Pt denies issues with constipation; No A fib or A flutter; Have any cardiac testing pending--NO Pt instructed to use [email or web address] or GoodRx for a price reduction on prep;   Insurance verified during PV appt=[employer] [place]  Patient's chart reviewed by Cathlyn Parsons CNRA prior to previsit and patient appropriate for the [facility].  Previsit completed and red dot placed by patient's name on their procedure day (on provider's schedule).    Instructions sent to patient via mail as well as a GoodRx [facility] coupon;

## 2023-06-26 ENCOUNTER — Ambulatory Visit: Payer: BLUE CROSS/BLUE SHIELD | Admitting: Internal Medicine

## 2023-06-26 ENCOUNTER — Encounter: Payer: Self-pay | Admitting: Internal Medicine

## 2023-06-26 VITALS — BP 107/58 | HR 61 | Temp 98.2°F | Resp 11 | Ht 72.0 in | Wt 205.0 lb

## 2023-06-26 DIAGNOSIS — Z1211 Encounter for screening for malignant neoplasm of colon: Secondary | ICD-10-CM

## 2023-06-26 MED ORDER — SODIUM CHLORIDE 0.9 % IV SOLN: 500.0000 mL | Freq: Once | INTRAVENOUS | Status: DC

## 2023-06-26 NOTE — Progress Notes (Signed)
To pacu, VSS. Report to [email or web address] 

## 2023-06-26 NOTE — Op Note (Signed)
[facility] Patient Name: Jeffrey Quinn Procedure Date: 06/26/2023 11:15 AM MRN: [id number] Endoscopist: Iva Boop , MD, [id number] Age: 47 Referring MD:  Date of Birth: 1976/08/02 Gender: Male Account #: [id number] Procedure:                Colonoscopy Indications:              Screening for colorectal malignant neoplasm, This                            is the patient's first colonoscopy Medicines:                Propofol per Anesthesia, Monitored Anesthesia Care Procedure:                Pre-Anesthesia Assessment:                           - Prior to the procedure, a History and Physical                            was performed, and patient medications and                            allergies were reviewed. The patient's tolerance of                            previous anesthesia was also reviewed. The risks                            and benefits of the procedure and the sedation                            options and risks were discussed with the patient.                            All questions were answered, and informed consent                            was obtained. Prior Anticoagulants: The patient has                            taken no anticoagulant or antiplatelet agents. ASA                            Grade Assessment: II - A patient with mild systemic                            disease. After reviewing the risks and benefits,                            the patient was deemed in satisfactory condition to                            undergo the procedure.  After obtaining informed consent, the colonoscope                            was passed under direct vision. Throughout the                            procedure, the patient's blood pressure, pulse, and                            oxygen saturations were monitored continuously. The                            Olympus CF-HQ190L ([id number]) Colonoscope was                             introduced through the anus and advanced to the the                            cecum, identified by appendiceal orifice and                            ileocecal valve. Anatomical landmarks were                            photographed. The quality of the bowel preparation                            was good. The colonoscopy was performed without                            difficulty. The patient tolerated the procedure                            well. The bowel preparation used was SUPREP via                            split dose instruction. Scope In: 11:29:13 AM Scope Out: 11:42:36 AM Scope Withdrawal Time: 0 hours 9 minutes 18 seconds  Total Procedure Duration: 0 hours 13 minutes 23 seconds  Findings:                 The perianal and digital rectal examinations were                            normal.                           The colon (entire examined portion) appeared normal.                           No additional abnormalities were found on                            retroflexion. Complications:            No immediate complications. Estimated blood loss:  None. Estimated Blood Loss:     Estimated blood loss: none. Impression:               - The entire examined colon is normal.                           - No specimens collected. Recommendation:           - Repeat colonoscopy in 10 years for screening                            purposes.                           - Patient has a contact number available for                            emergencies. The signs and symptoms of potential                            delayed complications were discussed with the                            patient. Return to normal activities tomorrow.                            Written discharge instructions were provided to the                            patient.                           - Resume previous diet.                           - Continue present medications. Iva Boop, MD 06/26/2023 11:46:37 AM This report has been signed electronically.

## 2023-06-26 NOTE — Patient Instructions (Addendum)
Normal exam - no polyps or cancer were seen.  Next routine colonoscopy or other screening test in 10 years - 2034.  I appreciate the opportunity to care for you. Iva Boop, MD, [facility]  Discharge instructions given. Normal exam. Resume previous medications. YOU HAD AN ENDOSCOPIC PROCEDURE TODAY AT THE [facility]:   Refer to the procedure report that was given to you for any specific questions about what was found during the examination.  If the procedure report does not answer your questions, please call your gastroenterologist to clarify.  If you requested that your care partner not be given the details of your procedure findings, then the procedure report has been included in a sealed envelope for you to review at your convenience later.  YOU SHOULD EXPECT: Some feelings of bloating in the abdomen. Passage of more gas than usual.  Walking can help get rid of the air that was put into your GI tract during the procedure and reduce the bloating. If you had a lower endoscopy (such as a colonoscopy or flexible sigmoidoscopy) you may notice spotting of blood in your stool or on the toilet paper. If you underwent a bowel prep for your procedure, you may not have a normal bowel movement for a few days.  Please Note:  You might notice some irritation and congestion in your nose or some drainage.  This is from the oxygen used during your procedure.  There is no need for concern and it should clear up in a day or so.  SYMPTOMS TO REPORT IMMEDIATELY:  Following lower endoscopy (colonoscopy or flexible sigmoidoscopy):  Excessive amounts of blood in the stool  Significant tenderness or worsening of abdominal pains  Swelling of the abdomen that is new, acute  Fever of 100F or higher   For urgent or emergent issues, a gastroenterologist can be reached at any hour by calling [phone]. Do not use MyChart messaging for urgent concerns.    DIET:  We do recommend a small meal at  first, but then you may proceed to your regular diet.  Drink plenty of fluids but you should avoid alcoholic beverages for 24 hours.  ACTIVITY:  You should plan to take it easy for the rest of today and you should NOT DRIVE or use heavy machinery until tomorrow (because of the sedation medicines used during the test).    FOLLOW UP: Our staff will call the number listed on your records the next business day following your procedure.  We will call around 7:15- 8:00 am to check on you and address any questions or concerns that you may have regarding the information given to you following your procedure. If we do not reach you, we will leave a message.     If any biopsies were taken you will be contacted by phone or by letter within the next 1-3 weeks.  Please call us at [phone] if you have not heard about the biopsies in 3 weeks.    SIGNATURES/CONFIDENTIALITY: You and/or your care partner have signed paperwork which will be entered into your electronic medical record.  These signatures attest to the fact that that the information above on your After Visit Summary has been reviewed and is understood.  Full responsibility of the confidentiality of this discharge information lies with you and/or your care-partner.

## 2023-06-26 NOTE — Progress Notes (Signed)
[facility] History and Physical   Primary Care Physician:  Pcp, No   Reason for Procedure:   CRCA screen  Plan:    colonoscopy     HPI: ASHDEN SONNENBERG is a 47 y.o. male heref or above   Past Medical History:  Diagnosis Date   Seasonal allergies     Past Surgical History:  Procedure Laterality Date   APPENDECTOMY  1998   INGUINAL HERNIA REPAIR Right 1980   PLANTAR FASCIA RELEASE Right 2020   TONSILLECTOMY     WISDOM TOOTH EXTRACTION      Prior to Admission medications   Medication Sig Start Date End Date Taking? Authorizing Provider  Acetylcysteine (NAC PO) Take 1 capsule by mouth daily.   Yes [provider]  Amino Acids (AMINO ACID PO) Take 1 capsule by mouth daily. L-Citruline   Yes [provider]  Arginine 500 MG CAPS Take 1 capsule by mouth daily. 1-2 capsules daily (powder)   Yes [provider]  Ascorbic Acid (VITAMIN C) 1000 MG tablet Take 1,000 mg by mouth daily.   Yes [provider]  Creatinine POWD 1 Dose by Does not apply route daily.   Yes [provider]  fluticasone (FLONASE) 50 MCG/ACT nasal spray Place 2 sprays into both nostrils daily.   Yes [provider]  KRILL OIL PO Take 1 capsule by mouth daily.   Yes [provider]  levOCARNitine (CARNITINE, L,) POWD 1 tablet by Does not apply route daily.   Yes [provider]  Lysine HCl 1000 MG TABS Take 1 tablet by mouth daily.   Yes [provider]  milk thistle 175 MG tablet Take 175 mg by mouth in the morning and at bedtime.   Yes [provider]  NON FORMULARY Take 1 capsule by mouth daily. Trimethylglycine   Yes [provider]  OVER THE COUNTER MEDICATION Take 1 tablet by mouth daily. Magnesium/Zinc Tablet   Yes [provider]  TAURINE PO Take 1 tablet by mouth daily.   Yes [provider]  Turmeric 500 MG CAPS Take 1 capsule by mouth in the morning and at bedtime.   Yes  [provider]  Vit D-Ca Beta Hydrox Beta Meth (HMB PRO PO) Take 1 tablet by mouth daily.   Yes [provider]  VITAMIN D-VITAMIN K PO Take 1 tablet by mouth daily.   Yes [provider]    Current Outpatient Medications  Medication Sig Dispense Refill   Acetylcysteine (NAC PO) Take 1 capsule by mouth daily.     Amino Acids (AMINO ACID PO) Take 1 capsule by mouth daily. L-Citruline     Arginine 500 MG CAPS Take 1 capsule by mouth daily. 1-2 capsules daily (powder)     Ascorbic Acid (VITAMIN C) 1000 MG tablet Take 1,000 mg by mouth daily.     Creatinine POWD 1 Dose by Does not apply route daily.     fluticasone (FLONASE) 50 MCG/ACT nasal spray Place 2 sprays into both nostrils daily.     KRILL OIL PO Take 1 capsule by mouth daily.     levOCARNitine (CARNITINE, L,) POWD 1 tablet by Does not apply route daily.     Lysine HCl 1000 MG TABS Take 1 tablet by mouth daily.     milk thistle 175 MG tablet Take 175 mg by mouth in the morning and at bedtime.     NON FORMULARY Take 1 capsule by mouth daily. Trimethylglycine  OVER THE COUNTER MEDICATION Take 1 tablet by mouth daily. Magnesium/Zinc Tablet     TAURINE PO Take 1 tablet by mouth daily.     Turmeric 500 MG CAPS Take 1 capsule by mouth in the morning and at bedtime.     Vit D-Ca Beta Hydrox Beta Meth (HMB PRO PO) Take 1 tablet by mouth daily.     VITAMIN D-VITAMIN K PO Take 1 tablet by mouth daily.     Current Facility-Administered Medications  Medication Dose Route Frequency Provider Last Rate Last Admin   0.9 %  sodium chloride infusion  500 mL Intravenous Once Iva Boop, MD        Allergies as of 06/26/2023   (No Known Allergies)    Family History  Problem Relation Age of Onset   Colon polyps Father 41   Colon polyps Sister 23   Colon cancer Neg Hx    Esophageal cancer Neg Hx    Stomach cancer Neg Hx    Rectal cancer Neg Hx     Social History   Socioeconomic History   Marital status:  Divorced    Spouse name: Not on file   Number of children: Not on file   Years of education: Not on file   Highest education level: Not on file  Occupational History   Not on file  Tobacco Use   Smoking status: Never   Smokeless tobacco: Never  Vaping Use   Vaping Use: Never used  Substance and Sexual Activity   Alcohol use: No   Drug use: No   Sexual activity: Not on file  Other Topics Concern   Not on file  Social History Narrative   Not on file   Social Determinants of Health   Financial Resource Strain: Not on file  Food Insecurity: Not on file  Transportation Needs: Not on file  Physical Activity: Not on file  Stress: Not on file  Social Connections: Not on file  Intimate Partner Violence: Not on file    Review of Systems:  All other review of systems negative except as mentioned in the HPI.  Physical Exam: Vital signs BP 133/61   Pulse 72   Temp 98.2 F (36.8 C)   Resp 14   Ht 6' (1.829 m)   Wt 205 lb (93 kg)   SpO2 96%   BMI 27.80 kg/m   General:   Alert,  Well-developed, well-nourished, pleasant and cooperative in NAD Lungs:  Clear throughout to auscultation.   Heart:  Regular rate and rhythm; no murmurs, clicks, rubs,  or gallops. Abdomen:  Soft, nontender and nondistended. Normal bowel sounds.   Neuro/Psych:  Alert and cooperative. Normal mood and affect. A and O x 3   @Gracelee Stemmler  Sena Slate, MD, [facility] [phone] (pager) 06/26/2023 11:24 AM@

## 2023-06-26 NOTE — Progress Notes (Signed)
Pt's states no medical or surgical changes since previsit or office visit. 

## 2023-06-27 ENCOUNTER — Telehealth: Payer: Self-pay

## 2023-06-27 NOTE — Telephone Encounter (Signed)
Attempted f/u call. No answer
